# Patient Record
Sex: Male | Born: 1990 | Race: Black or African American | Hispanic: No | Smoking: Never smoker
Health system: Southern US, Community
[De-identification: ages and names within clinical notes are randomized; demographics above are authoritative.]

---

## 2015-03-27 ENCOUNTER — Ambulatory Visit: Payer: Managed Care, Other (non HMO) | Admitting: Podiatry

## 2015-04-01 ENCOUNTER — Ambulatory Visit: Payer: Managed Care, Other (non HMO) | Admitting: Podiatry

## 2016-07-13 ENCOUNTER — Emergency Department (HOSPITAL_COMMUNITY): Payer: No Typology Code available for payment source

## 2016-07-13 ENCOUNTER — Emergency Department (HOSPITAL_COMMUNITY)
Admission: EM | Admit: 2016-07-13 | Discharge: 2016-07-14 | Disposition: A | Discharge status: Home / Self Care | Payer: Managed Care, Other (non HMO) | Attending: Emergency Medicine | Admitting: Emergency Medicine

## 2016-07-13 ENCOUNTER — Encounter (HOSPITAL_COMMUNITY): Payer: Self-pay

## 2016-07-13 ENCOUNTER — Emergency Department (HOSPITAL_COMMUNITY)
Admission: EM | Admit: 2016-07-13 | Discharge: 2016-07-13 | Disposition: A | Discharge status: Home / Self Care | Payer: No Typology Code available for payment source | Attending: Emergency Medicine | Admitting: Emergency Medicine

## 2016-07-13 DIAGNOSIS — Y999 Unspecified external cause status: Secondary | ICD-10-CM | POA: Diagnosis not present

## 2016-07-13 DIAGNOSIS — Y9389 Activity, other specified: Secondary | ICD-10-CM | POA: Insufficient documentation

## 2016-07-13 DIAGNOSIS — S199XXA Unspecified injury of neck, initial encounter: Secondary | ICD-10-CM | POA: Diagnosis present

## 2016-07-13 DIAGNOSIS — S63502A Unspecified sprain of left wrist, initial encounter: Secondary | ICD-10-CM | POA: Diagnosis not present

## 2016-07-13 DIAGNOSIS — Y939 Activity, unspecified: Secondary | ICD-10-CM | POA: Insufficient documentation

## 2016-07-13 DIAGNOSIS — S838X2A Sprain of other specified parts of left knee, initial encounter: Secondary | ICD-10-CM | POA: Diagnosis not present

## 2016-07-13 DIAGNOSIS — Z79899 Other long term (current) drug therapy: Secondary | ICD-10-CM | POA: Insufficient documentation

## 2016-07-13 DIAGNOSIS — S060X1A Concussion with loss of consciousness of 30 minutes or less, initial encounter: Secondary | ICD-10-CM | POA: Insufficient documentation

## 2016-07-13 DIAGNOSIS — Y92410 Unspecified street and highway as the place of occurrence of the external cause: Secondary | ICD-10-CM | POA: Insufficient documentation

## 2016-07-13 DIAGNOSIS — M25552 Pain in left hip: Secondary | ICD-10-CM | POA: Insufficient documentation

## 2016-07-13 DIAGNOSIS — R51 Headache: Secondary | ICD-10-CM | POA: Insufficient documentation

## 2016-07-13 DIAGNOSIS — S80212A Abrasion, left knee, initial encounter: Secondary | ICD-10-CM | POA: Diagnosis not present

## 2016-07-13 DIAGNOSIS — S0990XA Unspecified injury of head, initial encounter: Secondary | ICD-10-CM | POA: Diagnosis present

## 2016-07-13 LAB — CBC WITH DIFFERENTIAL/PLATELET
Basophils Absolute: 0 10*3/uL (ref 0.0–0.1)
Basophils Relative: 0 %
EOS ABS: 0.1 10*3/uL (ref 0.0–0.7)
EOS PCT: 2 %
HCT: 39.8 % (ref 39.0–52.0)
Hemoglobin: 13.3 g/dL (ref 13.0–17.0)
LYMPHS ABS: 2 10*3/uL (ref 0.7–4.0)
Lymphocytes Relative: 35 %
MCH: 30.2 pg (ref 26.0–34.0)
MCHC: 33.4 g/dL (ref 30.0–36.0)
MCV: 90.2 fL (ref 78.0–100.0)
MONOS PCT: 9 %
Monocytes Absolute: 0.5 10*3/uL (ref 0.1–1.0)
Neutro Abs: 3.1 10*3/uL (ref 1.7–7.7)
Neutrophils Relative %: 54 %
PLATELETS: 175 10*3/uL (ref 150–400)
RBC: 4.41 MIL/uL (ref 4.22–5.81)
RDW: 12.8 % (ref 11.5–15.5)
WBC: 5.7 10*3/uL (ref 4.0–10.5)

## 2016-07-13 LAB — COMPREHENSIVE METABOLIC PANEL
ALK PHOS: 45 U/L (ref 38–126)
ALT: 9 U/L — ABNORMAL LOW (ref 17–63)
ANION GAP: 8 (ref 5–15)
AST: 18 U/L (ref 15–41)
Albumin: 4.1 g/dL (ref 3.5–5.0)
BUN: 6 mg/dL (ref 6–20)
CALCIUM: 9.4 mg/dL (ref 8.9–10.3)
CHLORIDE: 105 mmol/L (ref 101–111)
CO2: 26 mmol/L (ref 22–32)
Creatinine, Ser: 1.1 mg/dL (ref 0.61–1.24)
Glucose, Bld: 95 mg/dL (ref 65–99)
Potassium: 3.7 mmol/L (ref 3.5–5.1)
SODIUM: 139 mmol/L (ref 135–145)
Total Bilirubin: 0.6 mg/dL (ref 0.3–1.2)
Total Protein: 7 g/dL (ref 6.5–8.1)

## 2016-07-13 MED ORDER — ONDANSETRON HCL 4 MG/2ML IJ SOLN
4.0000 mg | Freq: Once | INTRAMUSCULAR | Status: AC
  Administered 2016-07-13: 4 mg via INTRAVENOUS
  Filled 2016-07-13: qty 2

## 2016-07-13 MED ORDER — BACLOFEN 10 MG PO TABS: 10.0000 mg | ORAL_TABLET | Freq: Three times a day (TID) | ORAL | 0 refills | Status: AC

## 2016-07-13 MED ORDER — MELOXICAM 15 MG PO TABS: 15.0000 mg | ORAL_TABLET | Freq: Every day | ORAL | 0 refills | Status: AC

## 2016-07-13 MED ORDER — HYDROMORPHONE HCL 1 MG/ML IJ SOLN
0.5000 mg | Freq: Once | INTRAMUSCULAR | Status: AC
Start: 2016-07-13 — End: 2016-07-13
  Administered 2016-07-13: 0.5 mg via INTRAVENOUS
  Filled 2016-07-13: qty 1
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
## 2016-07-13 NOTE — ED Notes (Signed)
Pt refused blood work,  Development worker, community.
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
## 2016-07-13 NOTE — Discharge Instructions (Signed)
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
## 2016-07-13 NOTE — ED Notes (Signed)
Patient transported to CT 

## 2016-07-14 MED ORDER — DIPHENHYDRAMINE HCL 25 MG PO CAPS
50.0000 mg | ORAL_CAPSULE | Freq: Once | ORAL | Status: AC
  Administered 2016-07-14: 50 mg via ORAL
  Filled 2016-07-14: qty 2

## 2016-07-14 MED ORDER — METOCLOPRAMIDE HCL 10 MG PO TABS
10.0000 mg | ORAL_TABLET | Freq: Once | ORAL | Status: AC
  Administered 2016-07-14: 10 mg via ORAL
  Filled 2016-07-14: qty 1

## 2016-07-14 NOTE — ED Notes (Signed)
Pt requesting to be dc, EDP to be notified.

## 2016-07-14 NOTE — ED Notes (Signed)
Pt stable, ambulatory, states understanding of discharge instructions 
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
